# Patient Record
Sex: Female | Born: 1985 | Race: Black or African American | Hispanic: No | Marital: Single | Smoking: Never smoker
Health system: Southern US, Community
[De-identification: ages and names within clinical notes are randomized; demographics above are authoritative.]

## PROBLEM LIST (undated history)

## (undated) DIAGNOSIS — O24919 Unspecified diabetes mellitus in pregnancy, unspecified trimester: Secondary | ICD-10-CM

---

## 2016-05-15 ENCOUNTER — Emergency Department (HOSPITAL_COMMUNITY)
Admission: EM | Admit: 2016-05-15 | Discharge: 2016-05-15 | Disposition: A | Discharge status: Home / Self Care | Payer: No Typology Code available for payment source | Attending: Emergency Medicine | Admitting: Emergency Medicine

## 2016-05-15 ENCOUNTER — Encounter (HOSPITAL_COMMUNITY): Payer: Self-pay

## 2016-05-15 DIAGNOSIS — Y939 Activity, unspecified: Secondary | ICD-10-CM | POA: Diagnosis not present

## 2016-05-15 DIAGNOSIS — M25512 Pain in left shoulder: Secondary | ICD-10-CM | POA: Diagnosis not present

## 2016-05-15 DIAGNOSIS — M542 Cervicalgia: Secondary | ICD-10-CM | POA: Diagnosis not present

## 2016-05-15 DIAGNOSIS — R079 Chest pain, unspecified: Secondary | ICD-10-CM | POA: Insufficient documentation

## 2016-05-15 DIAGNOSIS — Y999 Unspecified external cause status: Secondary | ICD-10-CM | POA: Diagnosis not present

## 2016-05-15 DIAGNOSIS — Y92410 Unspecified street and highway as the place of occurrence of the external cause: Secondary | ICD-10-CM | POA: Diagnosis not present

## 2016-05-15 DIAGNOSIS — M25522 Pain in left elbow: Secondary | ICD-10-CM | POA: Diagnosis not present

## 2016-05-15 MED ORDER — IBUPROFEN 800 MG PO TABS: 800.0000 mg | ORAL_TABLET | Freq: Three times a day (TID) | ORAL | 0 refills | Status: DC

## 2016-05-15 MED ORDER — CYCLOBENZAPRINE HCL 10 MG PO TABS: 10.0000 mg | ORAL_TABLET | Freq: Two times a day (BID) | ORAL | 0 refills | Status: DC | PRN
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
## 2016-06-05 ENCOUNTER — Ambulatory Visit (HOSPITAL_COMMUNITY)
Admission: EM | Admit: 2016-06-05 | Discharge: 2016-06-05 | Disposition: A | Discharge status: Home / Self Care | Payer: No Typology Code available for payment source

## 2016-11-10 ENCOUNTER — Encounter (HOSPITAL_COMMUNITY): Payer: Self-pay

## 2016-11-10 ENCOUNTER — Emergency Department (HOSPITAL_COMMUNITY)
Admission: EM | Admit: 2016-11-10 | Discharge: 2016-11-10 | Disposition: A | Discharge status: Home / Self Care | Payer: Medicaid Other | Attending: Emergency Medicine | Admitting: Emergency Medicine

## 2016-11-10 DIAGNOSIS — J0110 Acute frontal sinusitis, unspecified: Secondary | ICD-10-CM | POA: Diagnosis not present

## 2016-11-10 DIAGNOSIS — R51 Headache: Secondary | ICD-10-CM | POA: Diagnosis present

## 2016-11-10 DIAGNOSIS — J0100 Acute maxillary sinusitis, unspecified: Secondary | ICD-10-CM | POA: Diagnosis not present

## 2016-11-10 HISTORY — DX: Unspecified diabetes mellitus in pregnancy, unspecified trimester: O24.919

## 2016-11-10 MED ORDER — AMOXICILLIN-POT CLAVULANATE 875-125 MG PO TABS: 1.0000 | ORAL_TABLET | Freq: Two times a day (BID) | ORAL | 0 refills | Status: DC

## 2016-11-10 NOTE — ED Provider Notes (Signed)
[facility]PT Provider Note   CSN: [id number] Arrival date & time: 11/10/16  1916  By signing my name below, I, Majel Homer, attest that this documentation has been prepared under the direction and in the presence of Terance Hart, PA-C . Electronically Signed: Majel Homer, Scribe. 11/10/2016. 9:16 PM.  History   Chief Complaint No chief complaint on file.  The history is provided by the patient. No language interpreter was used.   HPI Comments: Franceen Erisman is a 31 y.o. female who presents to the Emergency Department complaining of gradually worsening, headache that began ~2 days ago. Pt reports associated cough that exacerbates her headache, congestion, sinus pressure, rhinorrhea, sneezing, and muffled hearing in her bilateral ears described as "wearing earmuffs." She notes hx of sinusitis and states her current symptoms feels similar.  She reports she has taken Tylenol and Emergen-C with no relief of her symptoms. Pt denies sore throat, generalized body aches, and shortness of breath.   Past Medical History:  Diagnosis Date  . 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  History reviewed. No pertinent surgical history.  OB History    No data available     Home Medications    Prior to Admission medications   Medication Sig Start Date End Date Taking? Authorizing Provider  cyclobenzaprine (FLEXERIL) 10 MG tablet Take 1 tablet (10 mg total) by mouth 2 (two) times daily as needed for muscle spasms. 05/15/16   Fayrene Helper, PA-C  ibuprofen (ADVIL,MOTRIN) 800 MG tablet Take 1 tablet (800 mg total) by mouth 3 (three) times daily. 05/15/16   Fayrene Helper, PA-C    Family History No family history on file.  Social History Social History  Substance Use Topics  . Smoking status: Never Smoker  . Smokeless tobacco: Never Used  . Alcohol use No   Allergies   Patient has no known allergies.  Review of Systems Review of Systems  HENT:  Positive for congestion, hearing loss ("muffled hearing"), rhinorrhea, sinus pressure and sneezing. Negative for sore throat.   Respiratory: Positive for cough. Negative for shortness of breath.   Musculoskeletal: Negative for myalgias.  Neurological: Positive for headaches.   Physical Exam Updated Vital Signs BP 116/76 (BP Location: Right Arm)   Pulse 94   Temp 98.3 F (36.8 C) (Oral)   Resp 20   Ht 5\' 2"  (1.575 m)   Wt 181 lb 8 oz (82.3 kg)   LMP 11/05/2016   SpO2 100%   BMI 33.20 kg/m   Physical Exam  Constitutional: She is oriented to person, place, and time. She appears well-developed and well-nourished.  HENT:  Head: Normocephalic and atraumatic.  Tenderness to maxillary and frontal sinuses. Sounds congested. Swollen nasal mucosa.   Eyes: EOM are normal.  Neck: Normal range of motion.  Cardiovascular: Normal rate and regular rhythm.   Pulmonary/Chest: Effort normal.  Abdominal: She exhibits no distension.  Musculoskeletal: Normal range of motion.  Neurological: She is alert and oriented to person, place, and time.  Psychiatric: She has a normal mood and affect.  Nursing note and vitals reviewed.  ED Treatments / Results  DIAGNOSTIC STUDIES:  Oxygen Saturation is 100% on RA, normal by my interpretation.    COORDINATION OF CARE:  9:13 PM Discussed treatment plan with pt at bedside and pt agreed to plan.  Labs (all labs ordered are listed, but only abnormal results are displayed) Labs Reviewed - No data to display  EKG  EKG  Interpretation None       Radiology No results found.  Procedures Procedures (including critical care time)  Medications Ordered in ED Medications - No data to display  Initial Impression / Assessment and Plan / ED Course  I have reviewed the triage vital signs and the nursing notes.  Pertinent labs & imaging results that were available during my care of the patient were reviewed by me and considered in my medical decision  making (see chart for details).  31 year old female with viral illness. Vitals are normal. Advised adding Flonase and antihistamines. Since she just moved to the area and has no follow up rx given for Augmentin and advised to start this only if symptoms do not improve at 10 day mark. Return precautions given.  I personally performed the services described in this documentation, which was scribed in my presence. The recorded information has been reviewed and is accurate.   Final Clinical Impressions(s) / ED Diagnoses   Final diagnoses:  Acute non-recurrent maxillary sinusitis    New Prescriptions New Prescriptions   No medications on file     Bethel BornKelly Marie Daralyn Bert, PA-C 11/13/16 1516    Mancel BaleElliott Wentz, MD 11/14/16 0730

## 2016-11-10 NOTE — ED Notes (Signed)
Declined W/C at D/C and was escorted to lobby by RN. 

## 2016-11-10 NOTE — ED Triage Notes (Signed)
Pt states she believes she has an sinus infection; pt states sx x 4 days ago; pt c/o muffled hearing with cough, congestion, HA, and runny nose; Pt states HA at 6/10 on arrival. Pt a&ox 4.

## 2016-11-10 NOTE — Discharge Instructions (Signed)
Continue over the counter medicines. Take Flonase for nasal congestion and Zyrtec-D or the equivalent for congestion If you develop a fever or your symptoms have been going on for 10 days, please fill the antibiotic.

## 2016-12-11 ENCOUNTER — Emergency Department (HOSPITAL_COMMUNITY)
Admission: EM | Admit: 2016-12-11 | Discharge: 2016-12-11 | Disposition: A | Discharge status: Home / Self Care | Payer: Medicaid Other | Attending: Emergency Medicine | Admitting: Emergency Medicine

## 2016-12-11 ENCOUNTER — Encounter (HOSPITAL_COMMUNITY): Payer: Self-pay | Admitting: Emergency Medicine

## 2016-12-11 ENCOUNTER — Emergency Department (HOSPITAL_COMMUNITY): Payer: Medicaid Other

## 2016-12-11 DIAGNOSIS — R200 Anesthesia of skin: Secondary | ICD-10-CM

## 2016-12-11 LAB — POC URINE PREG, ED: PREG TEST UR: NEGATIVE

## 2016-12-11 NOTE — Discharge Instructions (Signed)
Return to the Emergency Department for new or worsening symptoms. Rest, apply ice, and elevate the foot. Make sure to wear shoes with plenty of support for your foot and ankle. Please call the number on your discharge paperwork to get established with a primary care provider.

## 2016-12-11 NOTE — ED Provider Notes (Signed)
[facility]-EMERGENCY DEPT Provider Note   CSN: [id number] Arrival date & time: 12/11/16  4098   By signing my name below, I, Bobbie Stack, attest that this documentation has been prepared under the direction and in the presence of Kelcie Currie, PA-C. Electronically Signed: Bobbie Stack, Scribe. 12/11/16. 11:23 PM. History   Chief Complaint Chief Complaint  Patient presents with  . Numbness    The history is provided by the patient. No language interpreter was used.  HPI Comments: Megan Blair is a 31 y.o. female who presents to the Emergency Department complaining of numbness in her left big toe that began 4 days ago. Her left foot began to hurt 3 days ago. She took her shoe off at that time and noted that her ankle was swollen. The pain from her foot is now radiating to her left ankle and up her leg into her knee. She states that she is currently experiencing numbness along with left ankle and knee pain. She states that the pain in her ankle and knee primarily occurs while she is walking. She denies any prior problems in this foot. 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 She reports that she has been wearing a pair of shoes to her classes, during which she primarily stands. She denies hx of diabetes. She denies fevers, right foot pain, or rash.  Past Medical History:  Diagnosis Date  . 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   History reviewed. No pertinent surgical history.  OB History    No data available       Home Medications    Prior to Admission medications   Medication Sig Start Date End Date Taking? Authorizing Provider  amoxicillin-clavulanate (AUGMENTIN) 875-125 MG tablet Take 1 tablet by mouth 2 (two) times daily. 11/17/16   Bethel Born, PA-C  cyclobenzaprine (FLEXERIL) 10 MG tablet Take 1 tablet (10 mg total) by mouth 2 (two) times daily  as needed for muscle spasms. 05/15/16   Fayrene Helper, PA-C  ibuprofen (ADVIL,MOTRIN) 800 MG tablet Take 1 tablet (800 mg total) by mouth 3 (three) times daily. 05/15/16   Fayrene Helper, PA-C    Family History History reviewed. No pertinent family history.  Social History Social History  Substance Use Topics  . Smoking status: Never Smoker  . Smokeless tobacco: Never Used  . Alcohol use No     Allergies   Patient has no known allergies.   Review of Systems Review of Systems  Constitutional: Positive for activity change. Negative for fever.  Respiratory: Negative for shortness of breath.   Cardiovascular: Negative for chest pain.  Gastrointestinal: Negative for abdominal pain.  Musculoskeletal: Positive for arthralgias and myalgias. Negative for back pain. Joint swelling: left ankle.       Left knee, ankle, and foot pain.  Skin: Negative for rash.  Allergic/Immunologic: Negative for immunocompromised state.  Neurological: Positive for numbness (left foot).   Physical Exam Updated Vital Signs BP 105/73 (BP Location: Left Arm)   Pulse 78   Temp 97.7 F (36.5 C) (Oral)   Resp 12   LMP 12/01/2016 (Exact Date)   SpO2 100%   Physical Exam  Constitutional: She appears well-developed and well-nourished. No distress.  HENT:  Head: Normocephalic and atraumatic.  Eyes: Conjunctivae are normal.  Neck: Neck supple.  Cardiovascular: Normal rate, regular rhythm and normal heart sounds.  Exam reveals  no gallop and no friction rub.   No murmur heard. Pulmonary/Chest: Effort normal and breath sounds normal. No respiratory distress. She has no wheezes. She has no rales.  Abdominal: Soft. She exhibits no distension. There is no tenderness. There is no guarding.  Musculoskeletal: She exhibits no edema.  Normal ROM of left ankle and knee. No erythema, warmth or swelling noted to the digits, foot, or ankle of the left foot. Decreased sharp and soft sensation to the plantar surface of the hallux  of the left foot. Sensation is intact along the dorsum of the foot, the lateral and medial aspects of the foot, and along the plantar surface of digits 2-5. 2+ PT and DP pulses.   Neurological: She is alert.  Skin: Skin is warm and dry. No rash noted. She is not diaphoretic.  Psychiatric: Her behavior is normal.  Nursing note and vitals reviewed.   ED Treatments / Results  DIAGNOSTIC STUDIES: Oxygen Saturation is 100% on RA, normal by my interpretation.    COORDINATION OF CARE: 11:17 PM Discussed treatment plan with pt at bedside and pt agreed to plan. I discussed the patient's left foot x-ray with her. I advised the patient that she can take ibuprofen, which should improve the pain.  Labs (all labs ordered are listed, but only abnormal results are displayed) Labs Reviewed  POC URINE PREG, ED    EKG  EKG Interpretation None       Radiology No results found.  Procedures Procedures (including critical care time)  Medications Ordered in ED Medications - No data to display   Initial Impression / Assessment and Plan / ED Course  I have reviewed the triage vital signs and the nursing notes.  Pertinent labs & imaging results that were available during my care of the patient were reviewed by me and considered in my medical decision making (see chart for details).     31 year old female with decreased sensation to the plantar surface of the left hallux that began for days ago. No suspicion for gout, septic joint, or cauda equina. Discussed with the patient that this is most likely due to the new shoes she has been wearing to class for the last 5 days. Encouraged her to wear shoe insoles,  Discussed return precaution and to use antiinflammatories for discomfort. Discussed how to get established with a PCP.    Final Clinical Impressions(s) / ED Diagnoses   Final diagnoses:  Numbness    New Prescriptions Discharge Medication List as of 12/11/2016 11:37 PM     I personally  performed the services described in this documentation, which was scribed in my presence. The recorded information has been reviewed and is accurate.    Barkley Boards, PA-C 12/13/16 2325    Marily Memos, MD 12/14/16 [phone]

## 2016-12-11 NOTE — ED Triage Notes (Signed)
Patient with numbness in left foot that has been going on since Monday.  She states now she continues with numbness and now has pain in left ankle and left knee, especially when walking.

## 2016-12-11 NOTE — ED Notes (Signed)
ED Provider at bedside. 

## 2017-07-08 ENCOUNTER — Encounter (HOSPITAL_COMMUNITY): Payer: Self-pay | Admitting: Emergency Medicine

## 2017-07-08 ENCOUNTER — Other Ambulatory Visit: Payer: Self-pay

## 2017-07-08 ENCOUNTER — Emergency Department (HOSPITAL_COMMUNITY)
Admission: EM | Admit: 2017-07-08 | Discharge: 2017-07-08 | Disposition: A | Discharge status: Home / Self Care | Payer: Medicaid Other | Attending: Emergency Medicine | Admitting: Emergency Medicine

## 2017-07-08 ENCOUNTER — Emergency Department (HOSPITAL_COMMUNITY): Payer: Medicaid Other

## 2017-07-08 DIAGNOSIS — Z23 Encounter for immunization: Secondary | ICD-10-CM | POA: Diagnosis not present

## 2017-07-08 DIAGNOSIS — W260XXA Contact with knife, initial encounter: Secondary | ICD-10-CM | POA: Diagnosis not present

## 2017-07-08 DIAGNOSIS — Z79899 Other long term (current) drug therapy: Secondary | ICD-10-CM | POA: Insufficient documentation

## 2017-07-08 DIAGNOSIS — Y929 Unspecified place or not applicable: Secondary | ICD-10-CM | POA: Diagnosis not present

## 2017-07-08 DIAGNOSIS — S61217A Laceration without foreign body of left little finger without damage to nail, initial encounter: Secondary | ICD-10-CM

## 2017-07-08 DIAGNOSIS — Y990 Civilian activity done for income or pay: Secondary | ICD-10-CM | POA: Diagnosis not present

## 2017-07-08 DIAGNOSIS — Y939 Activity, unspecified: Secondary | ICD-10-CM | POA: Insufficient documentation

## 2017-07-08 MED ORDER — TETANUS-DIPHTH-ACELL PERTUSSIS 5-2.5-18.5 LF-MCG/0.5 IM SUSP
0.5000 mL | Freq: Once | INTRAMUSCULAR | Status: AC
  Administered 2017-07-08: 0.5 mL via INTRAMUSCULAR
  Filled 2017-07-08: qty 0.5

## 2017-07-08 MED ORDER — BUPIVACAINE HCL 0.25 % IJ SOLN: 5.0000 mL | Freq: Once | INTRAMUSCULAR | Status: DC

## 2017-07-08 MED ORDER — CEPHALEXIN 500 MG PO CAPS: 500.0000 mg | ORAL_CAPSULE | Freq: Four times a day (QID) | ORAL | 0 refills | Status: AC

## 2017-07-08 MED ORDER — BUPIVACAINE HCL (PF) 0.25 % IJ SOLN
5.0000 mL | Freq: Once | INTRAMUSCULAR | Status: AC
  Administered 2017-07-08: 5 mL
  Filled 2017-07-08: qty 30

## 2017-07-08 NOTE — Discharge Instructions (Signed)
Please see the information and instructions below regarding your visit.  Your diagnoses today include:  1. Laceration of left little finger without foreign body without damage to nail, initial encounter     Tests performed today include: X-ray of the affected area that did not show any foreign bodies or broken bones Vital signs. See below for your results today.   Medications prescribed:   Take any prescribed medications only as directed.  Ibuprofen alternating with Tylenol for pain.   Keflex. Please take all of your antibiotics until finished.   You may develop abdominal discomfort or nausea from the antibiotic. If this occurs, you may take it with food. Some patients also get diarrhea with antibiotics. You may help offset this with probiotics which you can buy or get in yogurt. Do not eat or take the probiotics until 2 hours after your antibiotic. Some women develop vaginal yeast infections after antibiotics. If you develop unusual vaginal discharge after being on this medication, please see your primary care provider.   Some people develop allergies to antibiotics. Symptoms of antibiotic allergy can be mild and include a flat rash and itching. They can also be more serious and include:  ?Hives - Hives are raised, red patches of skin that are usually very itchy.  ?Lip or tongue swelling  ?Trouble swallowing or breathing  ?Blistering of the skin or mouth.  If you have any of these serious symptoms, please seek emergency medical care immediately.  Home care instructions:  Follow any educational materials and wound care instructions contained in this packet.   Keep affected area above the level of your heart when possible to minimize swelling. Wash area gently twice a day with warm soapy water. Do not apply alcohol or hydrogen peroxide directly over a wound. Cover the area if it is draining or weeping. Keep the bandage in place for 24 hours and refrain from getting the wound wet for 24  hours. After that, you may get the area wet, but please ensure that you dry it completely afterwards.  Please refrain from soaking sutures for long periods of time, or swimming in chlorinated water   You may apply antibiotic ointment such as Bacitracin or Neosporin.  Follow-up instructions: Suture Removal: Return to the Emergency Department or see your primary care care doctor in  days for a recheck of your wound and removal of your sutures or staples.    Return instructions:  Return to the Emergency Department if you have: Fever Worsening pain Worsening swelling of the wound Pus draining from the wound Redness of the skin that moves away from the wound, especially if it streaks away from the affected area  Any other emergent concerns  Your vital signs today were: BP 116/78 (BP Location: Right Arm)    Pulse 62    Temp 98.2 F (36.8 C) (Oral)    Resp 16    Ht 5' 2.5" (1.588 m)    Wt 80.7 kg (178 lb)    LMP 06/14/2017    SpO2 100%    BMI 32.04 kg/m  If your blood pressure (BP) was elevated on multiple readings during this visit above 130 for the top number or above 80 for the bottom number, please have this repeated by your primary care provider within one month. --------------  Thank you for allowing us to participate in your care today! It was a pleasure taking care of you.

## 2017-07-08 NOTE — ED Provider Notes (Signed)
[facility]-EMERGENCY DEPT Provider Note   CSN: [id number] Arrival date & time: 07/08/17  1126     History   Chief Complaint Chief Complaint  Patient presents with  . Extremity Laceration    HPI Megan Blair is a 31 y.o. female.  HPI   Patient is a 31 year old female with a history of pregnancy-induced diabetes presenting with a laceration to the left fifth digit that occurred just prior to arrival.  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  No other injuries occurred in this event.  Patient reports that she immediately applied pressure.  Patient denies any loss of sensation or decreased range of motion of the left little finger.  Patient reports that tetanus is not up-to-date.  Past Medical History:  Diagnosis Date  . 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   History reviewed. No pertinent surgical history.  OB History    No data available       Home Medications    Prior to Admission medications   Medication Sig Start Date End Date Taking? Authorizing Provider  amoxicillin-clavulanate (AUGMENTIN) 875-125 MG tablet Take 1 tablet by mouth 2 (two) times daily. 11/17/16   Bethel Born, PA-C  cephALEXin (KEFLEX) 500 MG capsule Take 1 capsule (500 mg total) 4 (four) times daily by mouth. 07/08/17   Aviva Kluver B, PA-C  cyclobenzaprine (FLEXERIL) 10 MG tablet Take 1 tablet (10 mg total) by mouth 2 (two) times daily as needed for muscle spasms. 05/15/16   Fayrene Helper, PA-C  ibuprofen (ADVIL,MOTRIN) 800 MG tablet Take 1 tablet (800 mg total) by mouth 3 (three) times daily. 05/15/16   Fayrene Helper, PA-C    Family History No family history on file.  Social History Social History   Tobacco Use  . Smoking status: Never Smoker  . Smokeless tobacco: Never Used    Substance Use Topics  . Alcohol use: No  . Drug use: Not on file     Allergies   Patient has no known allergies.   Review of Systems Review of Systems  Musculoskeletal: Negative for joint swelling.  Skin: Positive for wound.  Neurological: Negative for weakness and numbness.     Physical Exam Updated Vital Signs BP 116/78 (BP Location: Right Arm)   Pulse 62   Temp 98.2 F (36.8 C) (Oral)   Resp 16   Ht 5' 2.5" (1.588 m)   Wt 80.7 kg (178 lb)   LMP 06/14/2017   SpO2 100%   BMI 32.04 kg/m   Physical Exam  Constitutional: She appears well-developed and well-nourished. No distress.  Sitting comfortably in bed.  HENT:  Head: Normocephalic and atraumatic.  Eyes: Conjunctivae are normal. Right eye exhibits no discharge. Left eye exhibits no discharge.  EOMs normal to gross examination.  Neck: Normal range of motion.  Cardiovascular: Normal rate and regular rhythm.  Intact, 2+ radial and ulnar pulse of left hand.  Pulmonary/Chest:  Normal respiratory effort. Patient converses comfortably. No audible wheeze or stridor.  Abdominal: She exhibits no distension.  Musculoskeletal: Normal range of motion.  Hand Exam:  Inspection: There is approximately a 1.5 cm laceration over the dorsal aspect of the left 5th digit.  Subcutaneous tissue exposure but no tendon exposure.  The left fourth digit exhibits  a minor dermal laceration. ROM: Passive/active ROM intact at wrist, MCP, PIP, and DIP joints, thumb MCP and IP joints, and no rotational deformity of metacarpals noted. Ligamentous stability: No laxity to valgus/varus stress of MCP, PIP, or DIP joints. No joint laxity with radial stress of thumb. Flexor/Extensor tendons: FDS/FDP tendons intact in digits 2-5 at PIP/DIP joints, respectively; extensor tendons intact in all digits Nerve testing:  -Radial: Thumb/wrist extension intact and 5/5 strength with resistance. Sensation to light touch intact at dorsal CMC joint. -Median: Thumb  opposition intact and 5/5 strength with resistance. Sensation to light touch intact on palmar side of 1st and 2nd digits. -Ulnar: Thumb adduction intact and 5/5 strength with resistance. Sensation to light touch intact on palmar side of 5th digit.  Vascular: 2+ radial and ulnar pulses. Capillary refill <2 seconds b/l.  Neurological: She is alert.  Cranial nerves intact to gross observation. Patient moves extremities without difficulty.  Skin: Skin is warm and dry. She is not diaphoretic.  Psychiatric: She has a normal mood and affect. Her behavior is normal. Judgment and thought content normal.  Nursing note and vitals reviewed.    ED Treatments / Results  Labs (all labs ordered are listed, but only abnormal results are displayed) Labs Reviewed - No data to display  EKG  EKG Interpretation None       Radiology Dg Finger Little Left  Result Date: 07/08/2017 CLINICAL DATA:  Laceration with knife at work this afternoon. EXAM: LEFT LITTLE FINGER 2+V COMPARISON:  None. FINDINGS: Soft tissue swelling with laceration along the ulnar and dorsal aspect of the left fifth digit without radiopaque foreign body nor bony involvement. No joint dislocation or fracture. IMPRESSION: Soft tissue laceration of the left pinky without acute osseous abnormality. No radiopaque foreign body. Electronically Signed   By: Tollie Ethavid  Kwon M.D.   On: 07/08/2017 15:17    Procedures .Marland Kitchen.Laceration Repair Date/Time: 07/08/2017 6:15 PM Performed by: Elisha PonderMurray, Alyssa B, PA-C Authorized by: Elisha PonderMurray, Alyssa B, PA-C   Consent:    Consent obtained:  Verbal   Consent given by:  Patient   Risks discussed:  Infection, pain and poor cosmetic result Anesthesia (see MAR for exact dosages):    Anesthesia method:  Local infiltration   Local anesthetic:  Bupivacaine 0.25% w/o epi Laceration details:    Location:  Hand   Hand location:  L hand, dorsum   Length (cm):  2 Repair type:    Repair type:  Simple Pre-procedure  details:    Preparation:  Patient was prepped and draped in usual sterile fashion and imaging obtained to evaluate for foreign bodies Exploration:    Wound exploration: wound explored through full range of motion     Wound extent: areolar tissue violated     Contaminated: no   Treatment:    Area cleansed with:  Betadine   Amount of cleaning:  Standard   Irrigation solution:  Sterile saline   Irrigation volume:  500 ml   Irrigation method:  Pressure wash and syringe   Visualized foreign bodies/material removed: no   Skin repair:    Repair method:  Sutures   Suture size:  4-0   Suture material:  Nylon   Number of sutures:  8 Approximation:    Approximation:  Close   Vermilion border: well-aligned   Post-procedure details:    Dressing:  Non-adherent dressing   Patient tolerance of procedure:  Tolerated well, no immediate complications   (including critical care time)  Medications Ordered in ED  Medications  bupivacaine (PF) (MARCAINE) 0.25 % injection 5 mL (5 mLs Infiltration Given 07/08/17 1601)  Tdap (BOOSTRIX) injection 0.5 mL (0.5 mLs Intramuscular Given 07/08/17 1602)     Initial Impression / Assessment and Plan / ED Course  I have reviewed the triage vital signs and the nursing notes.  Pertinent labs & imaging results that were available during my care of the patient were reviewed by me and considered in my medical decision making (see chart for details).     Final Clinical Impressions(s) / ED Diagnoses   Final diagnoses:  Laceration of left little finger without foreign body without damage to nail, initial encounter   Patient with linear, completely laceration to the left fifth digit and minor epidermal laceration to the left fourth digit.  Laceration was also evaluated by Dr. Shaune Pollackameron Isaacs.  Sutures applied today.  Patient tolerated the procedure well.  Tetanus was updated today.  Patient was given return precautions for any erythema, increased swelling, decreased  range of motion, or streaking redness up the arm.  Keflex for prophylaxis per discussion with attending.  Patient is aware that she must have the sutures removed in 7-10 days.   ED Discharge Orders        Ordered    cephALEXin (KEFLEX) 500 MG capsule  4 times daily     07/08/17 1752       Delia ChimesMurray, Alyssa B, PA-C 07/08/17 1817    Nira Connardama, Pedro Eduardo, MD 07/13/17 [phone]

## 2017-07-08 NOTE — ED Triage Notes (Signed)
Per ems pt sharpening a knife resulting in laceration to left pinky finger.

## 2017-07-12 ENCOUNTER — Ambulatory Visit (HOSPITAL_COMMUNITY)
Admission: EM | Admit: 2017-07-12 | Discharge: 2017-07-12 | Disposition: A | Discharge status: Home / Self Care | Payer: Medicaid Other | Attending: Emergency Medicine | Admitting: Emergency Medicine

## 2017-07-12 ENCOUNTER — Encounter (HOSPITAL_COMMUNITY): Payer: Self-pay | Admitting: Emergency Medicine

## 2017-07-12 DIAGNOSIS — J0100 Acute maxillary sinusitis, unspecified: Secondary | ICD-10-CM | POA: Diagnosis not present

## 2017-07-12 MED ORDER — IBUPROFEN 600 MG PO TABS: 600.0000 mg | ORAL_TABLET | Freq: Four times a day (QID) | ORAL | 0 refills | Status: AC | PRN

## 2017-07-12 MED ORDER — DOXYCYCLINE HYCLATE 100 MG PO CAPS: 100.0000 mg | ORAL_CAPSULE | Freq: Two times a day (BID) | ORAL | 0 refills | Status: AC

## 2017-07-12 MED ORDER — FLUTICASONE PROPIONATE 50 MCG/ACT NA SUSP: 2.0000 | Freq: Every day | NASAL | 0 refills | Status: AC

## 2017-07-12 NOTE — ED Provider Notes (Signed)
HPI  SUBJECTIVE:  Megan Blair is a 31 y.o. female who presents for wound check and URI symptoms.  Patient was seen in the ER 4 days ago for left little finger laceration, had wound irrigated, 8 sutures placed, sent home with Keflex.  States that she is taking the Keflex.  She states that the swelling has improved, but she reports tenderness to palpation along the lateral aspect of her finger.  She states the pain is not getting worse, but is also not getting better.  She denies erythema streaking up her finger, purulent drainage, odor.  She tried to clean it with soap and water and is taking the Keflex.  No alleviating factors.  Symptoms are worse with palpation and when things excellently brush up against it.  Second, she reports nasal congestion, rhinorrhea, sneezing, itchy, watery eyes for the past 3 days.  She reports sinus pain and pressure, postnasal drip, scratchy sore throat and a nonproductive cough.  She tried Weston Brassick will NyQuil and increasing fluids.  Her sinus pressure is worse with lying down.  No alleviating factors.  She denies fevers, upper dental pain.  No wheezing, shortness of breath.  Past medical history negative for diabetes, hypertension, allergies.  LMP: 10/27.  Denies possibility of being pregnant.  PMD: None.  Past Medical History:  Diagnosis Date  . Diabetes in pregnancy     History reviewed. No pertinent surgical history.  No family history on file.  Social History   Tobacco Use  . Smoking status: Never Smoker  . Smokeless tobacco: Never Used  Substance Use Topics  . Alcohol use: No  . Drug use: Not on file    No current facility-administered medications for this encounter.   Current Outpatient Medications:  .  cephALEXin (KEFLEX) 500 MG capsule, Take 1 capsule (500 mg total) 4 (four) times daily by mouth., Disp: 20 capsule, Rfl: 0 .  doxycycline (VIBRAMYCIN) 100 MG capsule, Take 1 capsule (100 mg total) 2 (two) times daily for 7 days by mouth., Disp: 14  capsule, Rfl: 0 .  fluticasone (FLONASE) 50 MCG/ACT nasal spray, Place 2 sprays daily into both nostrils., Disp: 16 g, Rfl: 0 .  ibuprofen (ADVIL,MOTRIN) 600 MG tablet, Take 1 tablet (600 mg total) every 6 (six) hours as needed by mouth., Disp: 30 tablet, Rfl: 0  No Known Allergies   ROS  As noted in HPI.   Physical Exam  BP 102/68   Pulse 100   Temp 98.7 F (37.1 C)   Resp 18   LMP 06/14/2017   SpO2 100%   Constitutional: Well developed, well nourished, no acute distress Eyes:  EOMI, conjunctiva normal bilaterally HENT: Normocephalic, atraumatic,mucus membranes moist positive mucoid nasal congestion.  Positive maxillary sinus tenderness.  No frontal sinus tenderness.  Erythematous, swollen turbinates.  No obvious postnasal drip, cobblestoning. Respiratory: Normal inspiratory effort lungs clear bilaterally. Cardiovascular: Normal rate GI: nondistended skin: No rash, skin intact Musculoskeletal: Linear laceration proximal phalanx left little finger with 8 sutures intact.  Appears to be healing well.  Appropriately tender to palpation.  No erythema, edema, expressible purulent drainage.  Sensation distally grossly intact.  FDS/FDP intact.     Neurologic: Alert & oriented x 3, no focal neuro deficits Psychiatric: Speech and behavior appropriate   ED Course   Medications - No data to display  Orders Placed This Encounter  Procedures  . Apply finger splint static    Little finger    Standing Status:   Standing    Number  of Occurrences:   1    Order Specific Question:   Laterality    Answer:   Left    No results found for this or any previous visit (from the past 24 hour(s)). No results found.  ED Clinical Impression  Acute non-recurrent maxillary sinusitis   ED Assessment/Plan  ER records reviewed as noted in HPI  1.  Laceration/wound check.  Appears to be healing well.  Patient's main complaint is that it things keep bumping into its which makes the pain  worse.  Will place in a splint to protect it.  There are no signs of infection.  She will finish the Keflex.  Return here or see primary care physician in 6 days for suture removal  2.  Sinusitis, most likely from allergies.  [place]  She does have sinus tenderness, but has had the symptoms for less than 10 days, has no fever, so we will send home with a wait-and-see prescription of doxycycline.  Advised her to wait and fill it.  Recommended antihistamine/decongestant combination of her choice such as Claritin, Allegra, Zyrtec-D, saline nasal irrigation, Flonase, ibuprofen 600 mg to take with 1 g of Tylenol.  Providing primary care referral for routine care  Discussed labs, imaging, MDM, plan and followup with patient.  patient agrees with plan.   Meds ordered this encounter  Medications  . doxycycline (VIBRAMYCIN) 100 MG capsule    Sig: Take 1 capsule (100 mg total) 2 (two) times daily for 7 days by mouth.    Dispense:  14 capsule    Refill:  0  . fluticasone (FLONASE) 50 MCG/ACT nasal spray    Sig: Place 2 sprays daily into both nostrils.    Dispense:  16 g    Refill:  0  . ibuprofen (ADVIL,MOTRIN) 600 MG tablet    Sig: Take 1 tablet (600 mg total) every 6 (six) hours as needed by mouth.    Dispense:  30 tablet    Refill:  0    *This clinic note was created using Scientist, clinical (histocompatibility and immunogenetics)Dragon dictation software. Therefore, there may be occasional mistakes despite careful proofreading.   ?    Domenick GongMortenson, Kasean Denherder, MD 07/13/17 [phone]

## 2017-07-12 NOTE — Discharge Instructions (Signed)
Return here for follow-up with a primary care physician of your choice for suture removal in 6 days.  The sutures need to be in for 8-10 days total.  Wear the splint as needed for comfort.  Finish the Keflex.  Start some claritin-D or Allegra-D or Zyrtec-D for nasal congestion.  You may take 600 mg of motrin with 1 gram of tylenol up to 3-4 times a day as needed for pain. This is an effective combination for pain.  Most sinus infections are viral and do not need antibiotics unless you have a high fever, have had this for 10 days, or you get better and then get sick again. Use a neti pot or the NeilMed sinus rinse as often as you want to to reduce nasal congestion. Follow the directions on the box.   Below is a list of primary care practices who are taking new patients for you to follow-up with. [facility] [address] [place], [place] [address] [phone]  [facility] [phone] [address] [place] [address]  [facility]: [address] [place] [id number]  [phone]  [facility]: [address] [place] [id number]   [phone]  [facility]: [address] [place] [address]  [phone]  [facility] : [address] [place] [address] [phone]  [facility]: [address] [place] [phone]  [facility]: [address] [place] [id number] [phone]  Dr. Oneal GroutMahima Pandey [address] [address] [address] [facility] [place] [id number]  [phone]  Dr. Jackie PlumGeorge Osei-Bonsu, [facility]. [address] [place], [place] [address]  [phone]  Go to www.goodrx.com to look up your medications. This will give you a list of  where you can find your prescriptions at the most affordable prices. Or ask the pharmacist what the cash price is, or if they have any other discount programs available to help make your medication more affordable. This can be less expensive than what you would pay with insurance.

## 2017-07-12 NOTE — ED Triage Notes (Signed)
Pt has stitches in her L pinkie, placed 11/8. Pt also c/o cold symptoms. Pt c/o hand pain and cold symptoms.

## 2018-02-03 IMAGING — DX DG FOOT COMPLETE 3+V*L*
3 series · 3 of 3 positions shown · non-contrast
Comparison: None.

CLINICAL DATA: Foot numbness for 5 days, ankle and knee pain.

EXAM:
LEFT FOOT - COMPLETE 3+ VIEW

[x foot ap left]
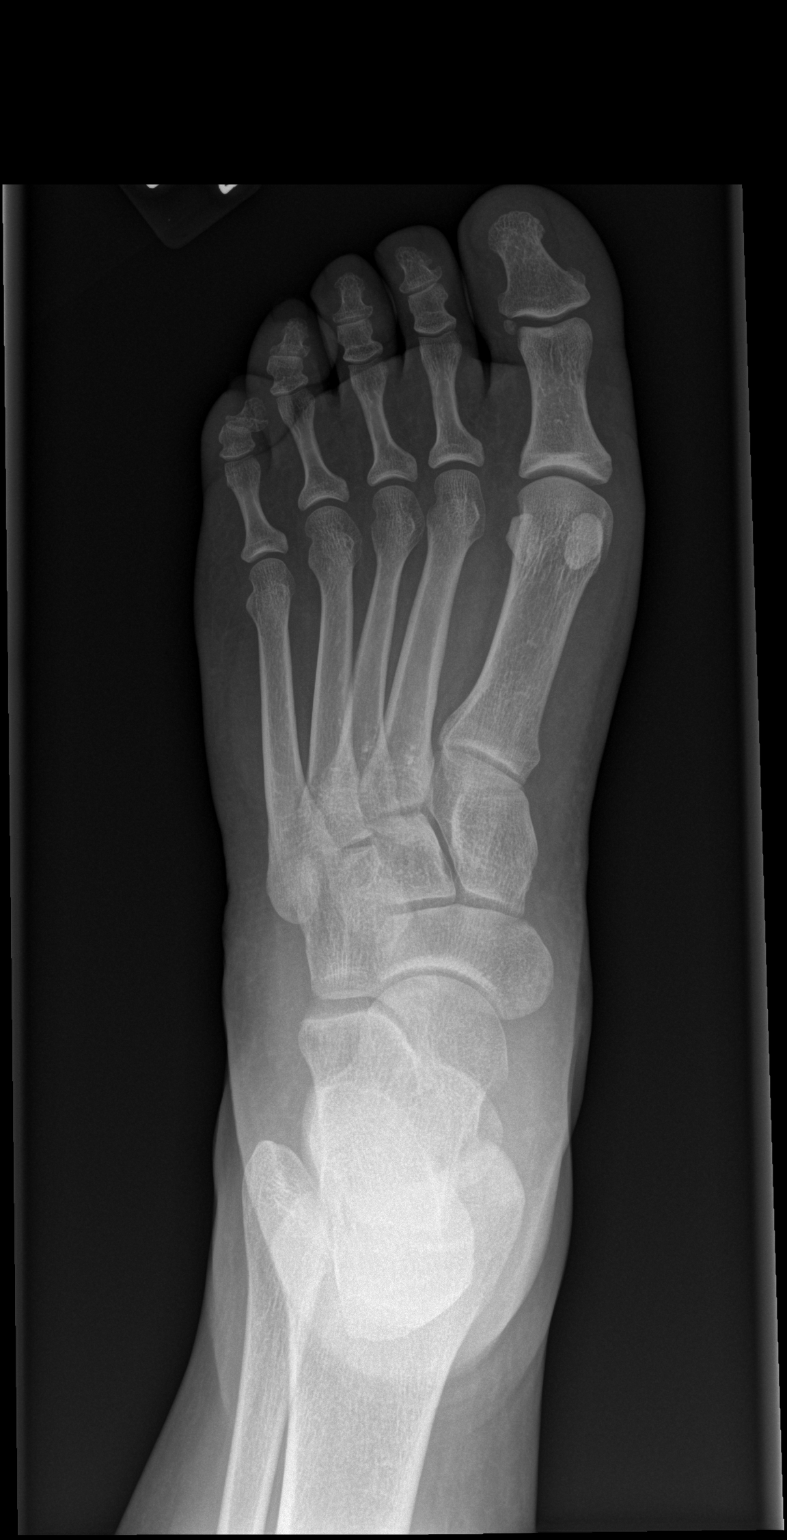

[x foot obl left]
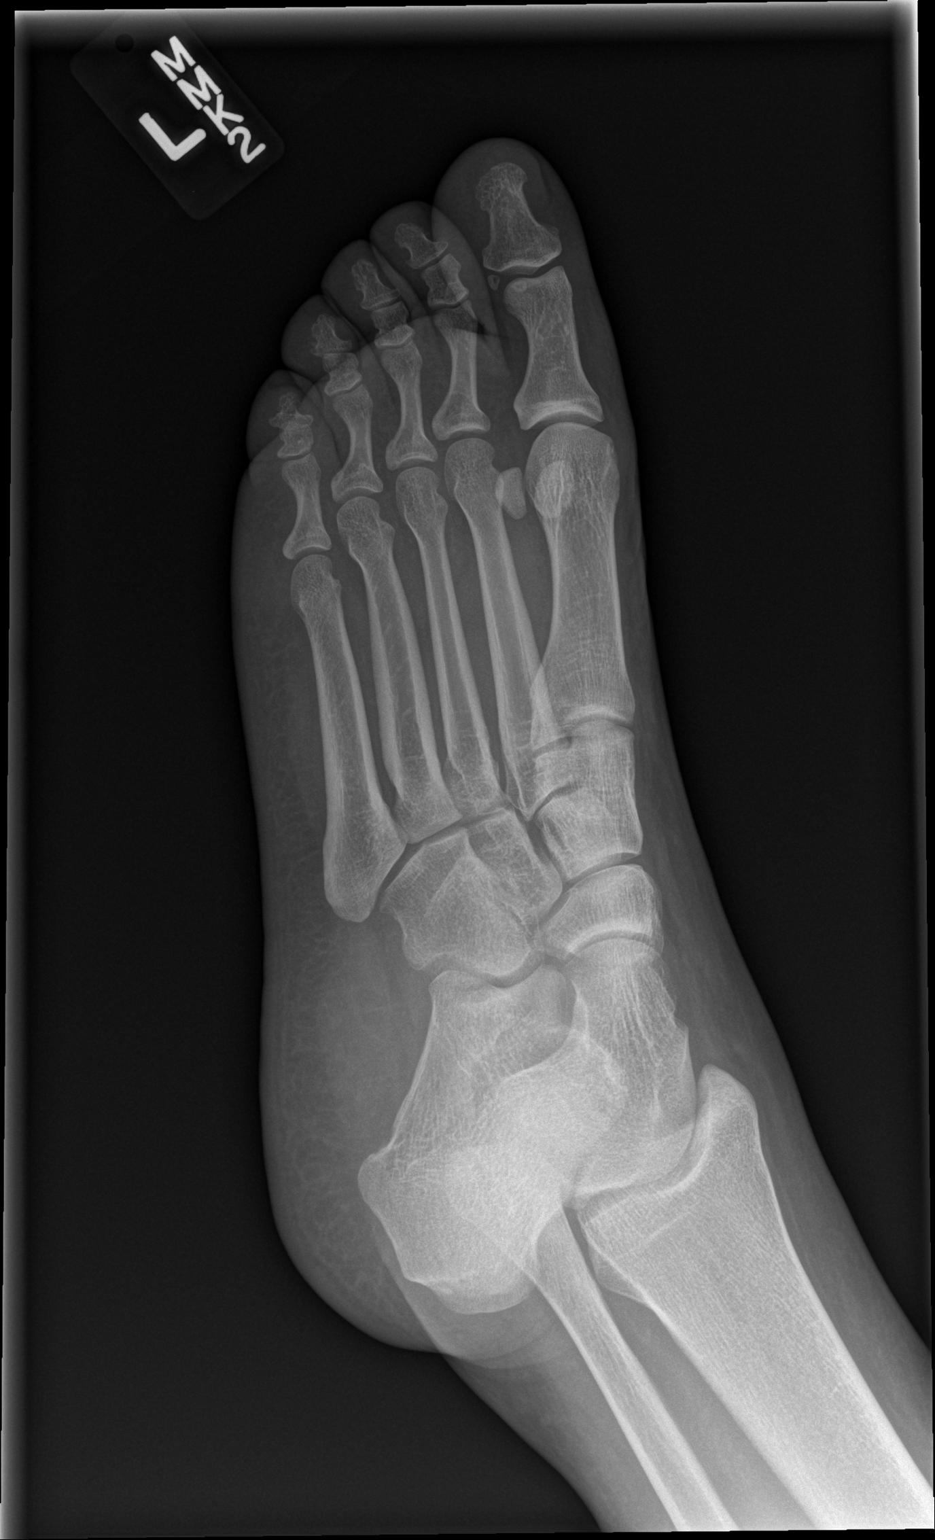

[x foot lat left]
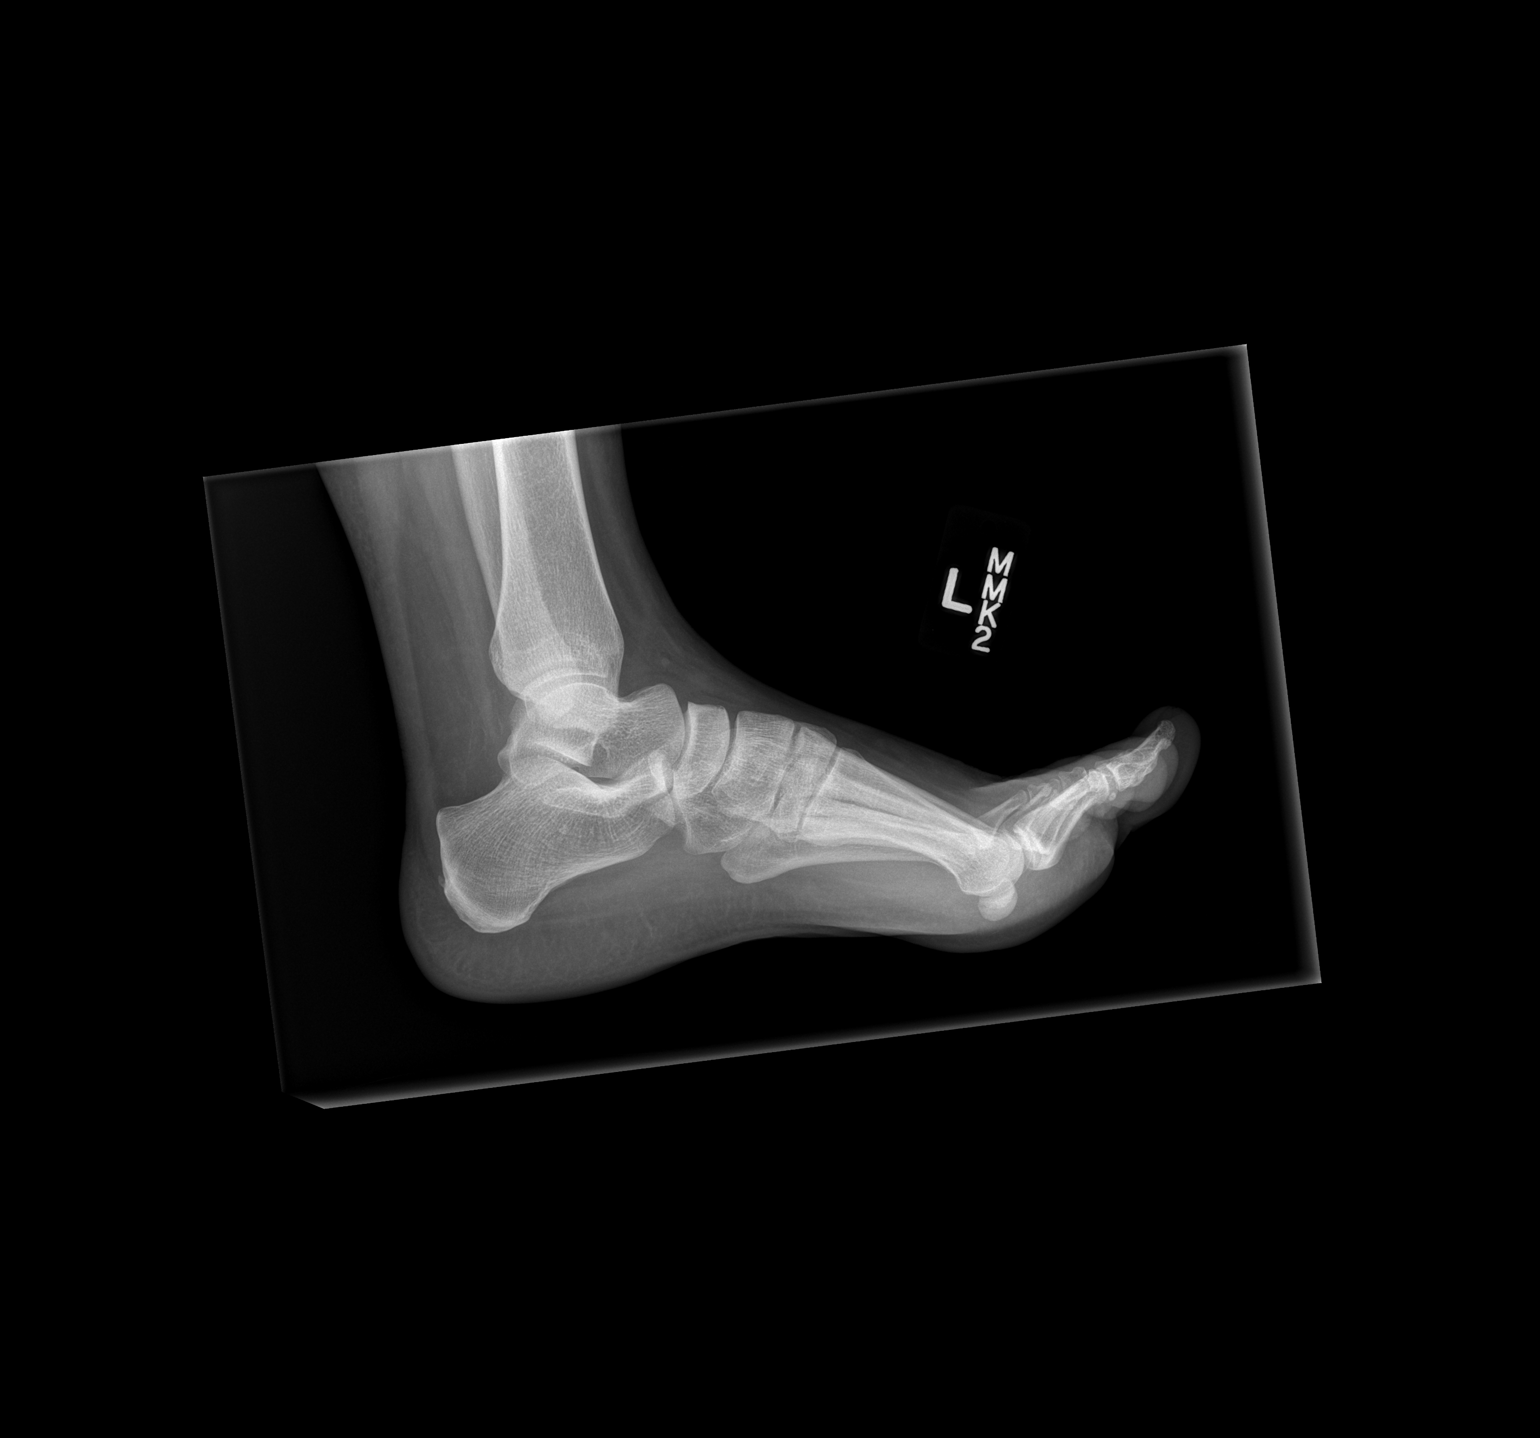

[3 of 3 positions shown; findings below may reference images not displayed]

FINDINGS: There is no evidence of fracture or dislocation. There is no
evidence of arthropathy or other focal bone abnormality. Soft
tissues are unremarkable.
IMPRESSION: Negative.

## 2018-08-31 IMAGING — CR DG FINGER LITTLE 2+V*L*
3 series · 3 of 3 positions shown · non-contrast
Comparison: None.

CLINICAL DATA: Laceration with knife at work this afternoon.

EXAM:
LEFT LITTLE FINGER 2+V

[x finger pa left]
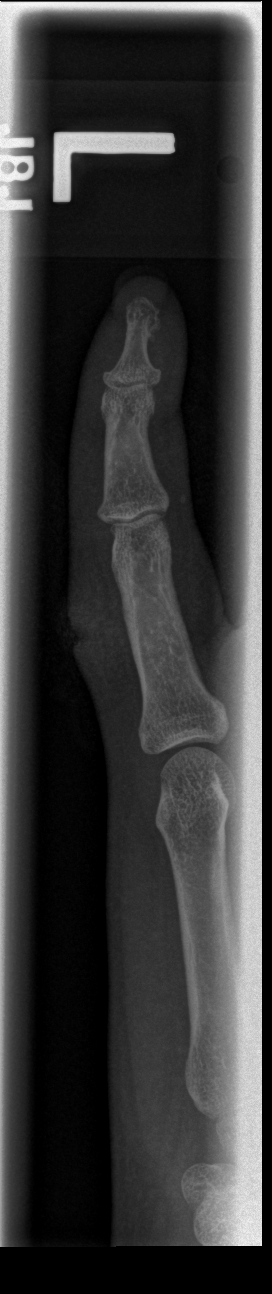

[x finger obl left]
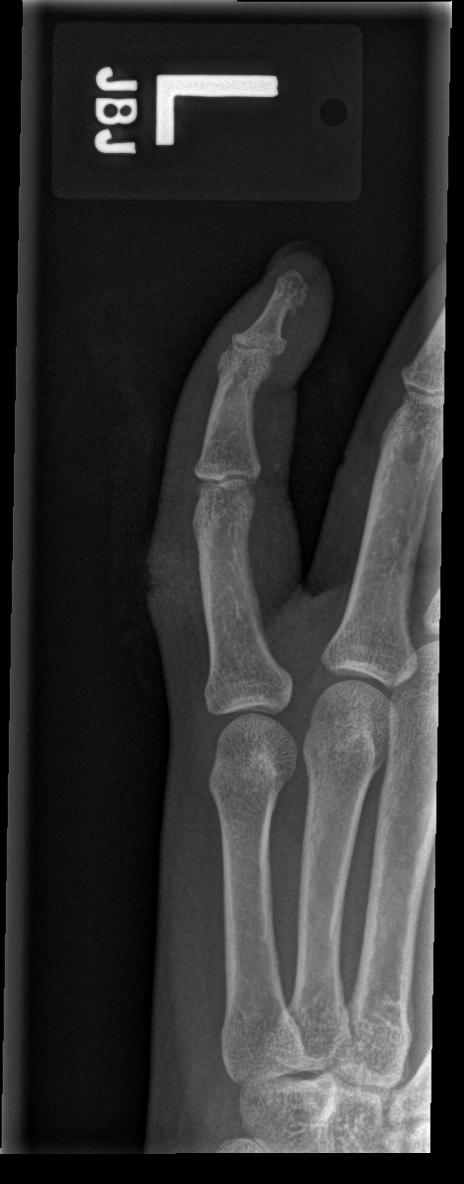

[x finger lat left]
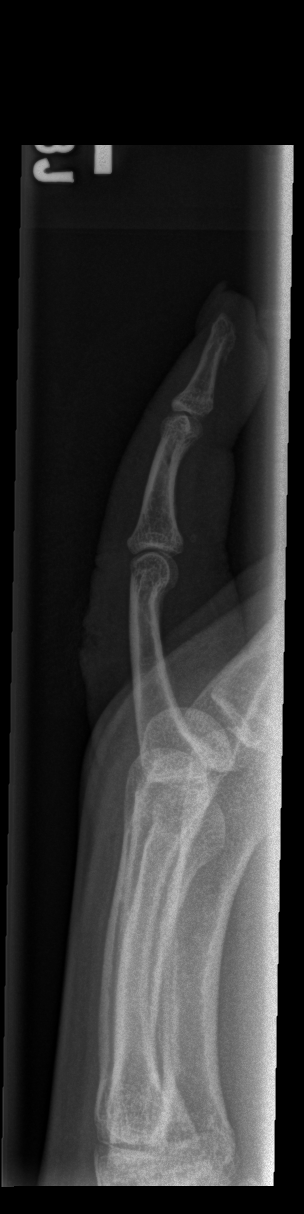

[3 of 3 positions shown; findings below may reference images not displayed]

FINDINGS: Soft tissue swelling with laceration along the ulnar and dorsal
aspect of the left fifth digit without radiopaque foreign body nor
bony involvement. No joint dislocation or fracture.
IMPRESSION: Soft tissue laceration of the left pinky without acute osseous
abnormality. No radiopaque foreign body.
